# Patient Record
Sex: Male | Born: 1980 | Race: White | Hispanic: No | Marital: Single | Smoking: Current every day smoker
Health system: Southern US, Community
[De-identification: ages and names within clinical notes are randomized; demographics above are authoritative.]

## PROBLEM LIST (undated history)

## (undated) HISTORY — PX: OTHER SURGICAL HISTORY: SHX169

---

## 2005-05-31 ENCOUNTER — Emergency Department (HOSPITAL_COMMUNITY): Admission: EM | Admit: 2005-05-31 | Discharge: 2005-05-31 | Payer: Self-pay | Admitting: Emergency Medicine

## 2007-09-17 ENCOUNTER — Emergency Department (HOSPITAL_COMMUNITY): Admission: EM | Admit: 2007-09-17 | Discharge: 2007-09-17 | Payer: Self-pay | Admitting: Emergency Medicine
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
## 2016-09-16 ENCOUNTER — Ambulatory Visit (HOSPITAL_COMMUNITY)
Admission: EM | Admit: 2016-09-16 | Discharge: 2016-09-16 | Disposition: A | Discharge status: Home / Self Care | Payer: Self-pay | Attending: Internal Medicine | Admitting: Internal Medicine

## 2016-09-16 ENCOUNTER — Encounter (HOSPITAL_COMMUNITY): Payer: Self-pay | Admitting: *Deleted

## 2016-09-16 DIAGNOSIS — L0291 Cutaneous abscess, unspecified: Secondary | ICD-10-CM

## 2016-09-16 DIAGNOSIS — L0390 Cellulitis, unspecified: Secondary | ICD-10-CM

## 2016-09-16 MED ORDER — HYDROCODONE-ACETAMINOPHEN 5-325 MG PO TABS: 2.0000 | ORAL_TABLET | ORAL | 0 refills | Status: DC | PRN

## 2016-09-16 MED ORDER — IBUPROFEN 800 MG PO TABS: 800.0000 mg | ORAL_TABLET | Freq: Three times a day (TID) | ORAL | 0 refills | Status: AC

## 2016-09-16 MED ORDER — CEPHALEXIN 500 MG PO CAPS: 500.0000 mg | ORAL_CAPSULE | Freq: Two times a day (BID) | ORAL | 0 refills | Status: AC

## 2016-09-16 MED ORDER — SULFAMETHOXAZOLE-TRIMETHOPRIM 800-160 MG PO TABS: 1.0000 | ORAL_TABLET | Freq: Two times a day (BID) | ORAL | 0 refills | Status: AC

## 2016-09-16 MED ORDER — LIDOCAINE HCL (PF) 1 % IJ SOLN
INTRAMUSCULAR | Status: AC
  Filled 2016-09-16: qty 30

## 2016-09-16 NOTE — ED Triage Notes (Signed)
Reports having 2 left axillary abscesses approx 3 wks ago that pt was able to drain; lesions healed.  Last week started with another to left lateral torso.  Lesion becoming worse, unable to drain.  Daughter was recently diagnosed with MRSA.  Has been taking IBU.  Had hot/cold flashes last night.

## 2016-09-16 NOTE — Discharge Instructions (Addendum)
Recheck abscess site in 48 hours at urgent care.

## 2016-09-16 NOTE — ED Notes (Signed)
Patient's incision covered with gauze and taped in placed per providers verbal order.

## 2016-09-18 ENCOUNTER — Encounter (HOSPITAL_COMMUNITY): Payer: Self-pay | Admitting: Family Medicine

## 2016-09-18 ENCOUNTER — Ambulatory Visit (HOSPITAL_COMMUNITY)
Admission: EM | Admit: 2016-09-18 | Discharge: 2016-09-18 | Disposition: A | Discharge status: Home / Self Care | Payer: Self-pay | Attending: Family Medicine | Admitting: Family Medicine

## 2016-09-18 DIAGNOSIS — Z5189 Encounter for other specified aftercare: Secondary | ICD-10-CM

## 2016-09-18 DIAGNOSIS — Z4801 Encounter for change or removal of surgical wound dressing: Secondary | ICD-10-CM

## 2016-09-18 MED ORDER — HYDROCODONE-ACETAMINOPHEN 5-325 MG PO TABS: 1.0000 | ORAL_TABLET | ORAL | 0 refills | Status: AC | PRN

## 2016-09-18 NOTE — Discharge Instructions (Signed)
Keep area clean and dry. Wash around the wound with alcohol. Keep the wound covered until it heals in. If the area of hardness and redness continues to get larger, if you develop a fever or there is worsening or increase in drainage of pus seek medical attention promptly.

## 2016-09-18 NOTE — ED Triage Notes (Signed)
Pt here for re check on abscess to left lateral chest area. sts very painful. Area still very red and swollen with drainage.
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
## 2016-09-18 NOTE — ED Provider Notes (Addendum)
[facility]    CSN: [id number] Arrival date & time: 09/16/16  1216     History   Chief Complaint Chief Complaint  Patient presents with  . Abscess    HPI Anthony Ashley is a 35 y.o. male. Presents today with 1 week hx painful red swelling below L axilla, increasing pain/size, not draining.  No fever, no malaise.  Drained 2 smaller boils in left axilla prior to onset of lesion below left axilla.  Very uncomfortable.  Daughter recently dx'ed with MRSA.      HPI  History reviewed. No pertinent past medical history.  Past Surgical History:  Procedure Laterality Date  . HAND INFECTION         Home Medications        Takes no meds regularly   Family History No family history on file.  Social History Social History  Substance Use Topics  . Smoking status: Current Every Day Smoker  . Smokeless tobacco: Never Used  . Alcohol use Yes     Comment: occasional     Allergies   Review of patient's allergies indicates no known allergies.   Review of Systems Review of Systems  All other systems reviewed and are negative.    Physical Exam Triage Vital Signs ED Triage Vitals  Enc Vitals Group     BP 09/16/16 1319 108/64     Pulse Rate 09/16/16 1319 95     Resp 09/16/16 1319 16     Temp 09/16/16 1319 98 F (36.7 C)     Temp Source 09/16/16 1319 Oral     SpO2 09/16/16 1319 98 %     Weight --      Height --      Head Circumference --      Peak Flow --      Pain Score 09/16/16 1320 7     Pain Loc --      Pain Edu? --      Excl. in GC? --    No data found.   Updated Vital Signs BP 108/64   Pulse 95   Temp 98 F (36.7 C) (Oral)   Resp 16   SpO2 98%   Visual Acuity Right Eye Distance:   Left Eye Distance:   Bilateral Distance:    Right Eye Near:   Left Eye Near:    Bilateral Near:     Physical Exam  Constitutional: He is oriented to person, place, and time. No distress.  Alert, nicely groomed  HENT:  Head: Atraumatic.  Eyes:    Conjugate gaze, no eye redness/drainage  Neck: Neck supple.  Cardiovascular: Normal rate.   Pulmonary/Chest: No respiratory distress.  Abdominal: He exhibits no distension.  Musculoskeletal: Normal range of motion.  Neurological: He is alert and oriented to person, place, and time.  Skin: Skin is warm and dry.  No cyanosis 5x5 inch exquisitely tender indurated red hot area below left axilla, not really fluctuant but swollen, with punctum. No streak.  Nursing note and vitals reviewed.    UC Treatments / Results   Procedures Procedures (including critical care time)      Area prepped with hibiclens/saline and infiltrated centrally with 1% lidocaine without epi.  A 2cm incision was made with release of a small amount of purulent material.  Necrotic debris was debrided from edges of incised area.  Wound cavity was probed bluntly, without release of significant pus.  Short wick placed, and antibiotic oint/bandage applied.  Final Clinical Impressions(s) / UC Diagnoses   Final diagnoses:  Abscess and cellulitis   Recheck abscess site in 48 hours at urgent care.  Note for work given.  New Prescriptions Discharge Medication List as of 09/16/2016  2:33 PM    START taking these medications   Details  cephALEXin (KEFLEX) 500 MG capsule Take 1 capsule (500 mg total) by mouth 2 (two) times daily., Starting Sun 09/16/2016, Normal    HYDROcodone-acetaminophen (NORCO/VICODIN) 5-325 MG tablet Take 2 tablets by mouth every 4 (four) hours as needed., Starting Sun 09/16/2016, Print    ibuprofen (ADVIL,MOTRIN) 800 MG tablet Take 1 tablet (800 mg total) by mouth 3 (three) times daily., Starting Sun 09/16/2016, Normal    sulfamethoxazole-trimethoprim (BACTRIM DS,SEPTRA DS) 800-160 MG tablet Take 1 tablet by mouth 2 (two) times daily., Starting Sun 09/16/2016, Until Wed 09/26/2016, Normal             Eustace MooreLaura W Philipp Callegari, MD 09/18/16 [phone]
